# Patient Record
Sex: Male | Born: 1935 | Race: White | Hispanic: No | Marital: Married
Health system: Midwestern US, Community
[De-identification: ages and names within clinical notes are randomized; demographics above are authoritative.]

## PROBLEM LIST (undated history)

## (undated) DIAGNOSIS — Z483 Aftercare following surgery for neoplasm: Secondary | ICD-10-CM

## (undated) DIAGNOSIS — E859 Amyloidosis, unspecified: Secondary | ICD-10-CM

---

## 2015-08-04 LAB — PROSTATE SPECIFIC ANTIGEN, TOTAL (PSA): PROSTATIC ANTIGEN: 1.63 NG/ML (ref 0–4.0)

## 2015-11-14 ENCOUNTER — Ambulatory Visit

## 2016-08-04 LAB — PROSTATE SPECIFIC ANTIGEN, TOTAL (PSA): PROSTATIC ANTIGEN: 1.84 NG/ML (ref 0–4.0)

## 2018-03-15 ENCOUNTER — Encounter

## 2018-03-20 ENCOUNTER — Inpatient Hospital Stay: Admit: 2018-03-20 | Discharge: 2018-03-20 | Payer: MEDICARE

## 2018-03-20 DIAGNOSIS — E859 Amyloidosis, unspecified: Secondary | ICD-10-CM

## 2018-03-20 MED ORDER — ALBUTEROL SULFATE 0.083 % (0.83 MG/ML) SOLN FOR INHALATION
2.5 mg /3 mL (0.083 %) | RESPIRATORY_TRACT | Status: AC
Start: 2018-03-20 — End: 2018-03-20
  Administered 2018-03-20: 13:00:00 via RESPIRATORY_TRACT

## 2018-03-20 MED FILL — ALBUTEROL SULFATE 0.083 % (0.83 MG/ML) SOLN FOR INHALATION: 2.5 mg /3 mL (0.083 %) | RESPIRATORY_TRACT | Qty: 1

## 2018-03-20 NOTE — Procedures (Signed)
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  According to the pulmonary function lab questionnaire, the patient is a lifelong nonsmoker.  He denies cough and wheezing, but reports chest tightness and shortness of breath on exertion.    TEST COMMENTS:  Effort and cooperation are good.    TEST RESULTS:  The flow volume loop is normal.  Spirometry is acceptable and reproducible.  The ratio of FEV1/FVC is normal at 75.  Both forced vital capacity and FEV1 are 85% predicted, which is at the lower end of normal.  There was no significant bronchodilator response in FEV1.  Mid expiratory flow rate was normal.  Peak expiratory flow rate was also normal.     Lung volumes by plethysmography show a normal total lung capacity of 5.13 L, which is 81% predicted.  This is at the lower end of the normal range, which is 80% to 100% predicted.  There is no evidence for air trapping.     Diffusion capacity is normal at 98% predicted.     Comparison with prior examination is not available.    IMPRESSION:  Full, in-lab pulmonary function testing is normal.  This patient's lung volumes are at the lower end of the normal range.          ____________________________________  Noah Ceahristopher Childs Kenyatte Gruber, MD    CCD/MODL  D:  03/21/2018 13:15:35  T:  03/21/2018 13:26:08  MModal Job#: [id number]  Doc#:  [id number]    CC:??      Rosana HoesBoris Golosarsky, MD             [[id number]] [[id number]]                           [address], [address]                           [place] [id number]             Fax: [phone]

## 2018-03-21 NOTE — Procedures (Signed)
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  According to the pulmonary function lab questionnaire, the patient is a lifelong nonsmoker.  He denies cough and wheezing, but reports chest tightness and shortness of breath on exertion.    TEST COMMENTS:  Effort and cooperation are good.    TEST RESULTS:  The flow volume loop is normal.  Spirometry is acceptable and reproducible.  The ratio of FEV1/FVC is normal at 75.  Both forced vital capacity and FEV1 are 85% predicted, which is at the lower end of normal.  There was no significant bronchodilator response in FEV1.  Mid expiratory flow rate was normal.  Peak expiratory flow rate was also normal.     Lung volumes by plethysmography show a normal total lung capacity of 5.13 L, which is 81% predicted.  This is at the lower end of the normal range, which is 80% to 100% predicted.  There is no evidence for air trapping.     Diffusion capacity is normal at 98% predicted.     Comparison with prior examination is not available.    IMPRESSION:  Full, in-lab pulmonary function testing is normal.  This patient's lung volumes are at the lower end of the normal range.          ____________________________________  Leliana Kontz Childs Analiyah Lechuga, MD    CCD/MODL  D:  03/21/2018 13:15:35  T:  03/21/2018 13:26:08  MModal Job#: [id number]  Doc#:  [id number]    CC:??      Boris Golosarsky, MD             [[id number]] [[id number]]                           [address], [address]                           [place] [address]             Fax: [phone]

## 2020-02-18 LAB — LIPID PROFILE (EXT)
Chol/HDL Ratio (EXT): 3.2
Cholesterol (EXT): 175 mg/dL (ref <=200)
HDL Cholesterol (EXT): 55 mg/dL (ref 41–60)
LDL Cholesterol (EXT): 118 mg/dL (ref <=130)
Triglycerides (EXT): 60 mg/dL (ref <=150)

## 2020-03-24 LAB — LIPID PROFILE (EXT)
Chol/HDL Ratio (EXT): 3.1
Cholesterol (EXT): 116 mg/dL (ref <=200)
HDL Cholesterol (EXT): 38 mg/dL — ABNORMAL LOW (ref 41–60)
LDL Cholesterol (EXT): 74 mg/dL (ref <=130)
Triglycerides (EXT): 48 mg/dL (ref <=150)

## 2020-03-25 LAB — UNMAPPED LAB RESULTS: Creatinine, urine, random (INT/EXT): 32 mg/dL

## 2020-03-25 LAB — OCCULT BLOOD, FECAL (EXT): Occult Blood, Fecal (EXT): NEGATIVE

## 2020-05-01 LAB — UNMAPPED LAB RESULTS: Phosphorous (EXT): 3.6 mg/dL (ref 2.4–5.1)

## 2020-05-20 LAB — UNMAPPED LAB RESULTS: Phosphorous (EXT): 4.8 mg/dL (ref 2.4–5.1)

## 2020-06-03 LAB — UNMAPPED LAB RESULTS: Phosphorous (EXT): 4 mg/dL (ref 2.4–5.1)

## 2020-06-17 LAB — UNMAPPED LAB RESULTS: Phosphorous (EXT): 4 mg/dL (ref 2.4–5.1)

## 2020-07-16 LAB — BMP (EXT)
Anion Gap (EXT): 16 mmol/L (ref 3–17)
BUN (EXT): 29 mg/dL — ABNORMAL HIGH (ref 8–25)
CO2 (EXT): 20 mmol/L — ABNORMAL LOW (ref 23–32)
CalciumCalcium (EXT): 8.7 mg/dL (ref 8.5–10.5)
Chloride (EXT): 103 mmol/L (ref 98–108)
Creatinine (EXT): 1.37 mg/dL (ref 0.60–1.50)
GFR Estimated (Calc) (EXT): 51 mL/min/{1.73_m2} — ABNORMAL LOW (ref >59)
Glucose (EXT): 145 mg/dL — ABNORMAL HIGH (ref 70–110)
Potassium (EXT): 4.7 mmol/L (ref 3.4–5.0)
Sodium (EXT): 139 mmol/L (ref 135–145)

## 2020-07-17 LAB — BMP (EXT)
Anion Gap (EXT): 10 mmol/L (ref 3–17)
BUN (EXT): 28 mg/dL — ABNORMAL HIGH (ref 8–25)
CO2 (EXT): 24 mmol/L (ref 23–32)
CalciumCalcium (EXT): 8.6 mg/dL (ref 8.5–10.5)
Chloride (EXT): 106 mmol/L (ref 98–108)
Creatinine (EXT): 1.06 mg/dL (ref 0.60–1.50)
GFR Estimated (Calc) (EXT): 69 mL/min/{1.73_m2} (ref >59)
Glucose (EXT): 80 mg/dL (ref 70–110)
Potassium (EXT): 4.3 mmol/L (ref 3.4–5.0)
Sodium (EXT): 140 mmol/L (ref 135–145)

## 2020-07-18 LAB — BMP (EXT)
Anion Gap (EXT): 14 mmol/L (ref 3–17)
BUN (EXT): 23 mg/dL (ref 8–25)
CO2 (EXT): 22 mmol/L — ABNORMAL LOW (ref 23–32)
CalciumCalcium (EXT): 8.5 mg/dL (ref 8.5–10.5)
Chloride (EXT): 103 mmol/L (ref 98–108)
Creatinine (EXT): 0.95 mg/dL (ref 0.60–1.50)
GFR Estimated (Calc) (EXT): 78 mL/min/{1.73_m2} (ref >59)
Glucose (EXT): 60 mg/dL — ABNORMAL LOW (ref 70–110)
Potassium (EXT): 4 mmol/L (ref 3.4–5.0)
Sodium (EXT): 139 mmol/L (ref 135–145)

## 2020-07-19 LAB — BMP (EXT)
Anion Gap (EXT): 13 mmol/L (ref 3–17)
BUN (EXT): 20 mg/dL (ref 8–25)
CO2 (EXT): 24 mmol/L (ref 23–32)
CalciumCalcium (EXT): 8.7 mg/dL (ref 8.5–10.5)
Chloride (EXT): 102 mmol/L (ref 98–108)
Creatinine (EXT): 0.94 mg/dL (ref 0.60–1.50)
GFR Estimated (Calc) (EXT): 79 mL/min/{1.73_m2} (ref >59)
Glucose (EXT): 65 mg/dL — ABNORMAL LOW (ref 70–110)
Potassium (EXT): 4 mmol/L (ref 3.4–5.0)
Sodium (EXT): 139 mmol/L (ref 135–145)

## 2020-07-20 LAB — BMP (EXT)
Anion Gap (EXT): 12 mmol/L (ref 3–17)
BUN (EXT): 17 mg/dL (ref 8–25)
CO2 (EXT): 23 mmol/L (ref 23–32)
CalciumCalcium (EXT): 8.6 mg/dL (ref 8.5–10.5)
Chloride (EXT): 101 mmol/L (ref 98–108)
Creatinine (EXT): 0.96 mg/dL (ref 0.60–1.50)
GFR Estimated (Calc) (EXT): 77 mL/min/{1.73_m2} (ref >59)
Glucose (EXT): 100 mg/dL (ref 70–110)
Potassium (EXT): 4.1 mmol/L (ref 3.4–5.0)
Sodium (EXT): 136 mmol/L (ref 135–145)

## 2020-07-21 LAB — BMP (EXT)
Anion Gap (EXT): 17 mmol/L (ref 3–17)
BUN (EXT): 14 mg/dL (ref 8–25)
CO2 (EXT): 21 mmol/L — ABNORMAL LOW (ref 23–32)
CalciumCalcium (EXT): 8.7 mg/dL (ref 8.5–10.5)
Chloride (EXT): 99 mmol/L (ref 98–108)
Creatinine (EXT): 0.95 mg/dL (ref 0.60–1.50)
GFR Estimated (Calc) (EXT): 78 mL/min/{1.73_m2} (ref >59)
Glucose (EXT): 76 mg/dL (ref 70–110)
Potassium (EXT): 3.8 mmol/L (ref 3.4–5.0)
Sodium (EXT): 137 mmol/L (ref 135–145)

## 2020-07-22 LAB — BMP (EXT)
Anion Gap (EXT): 13 mmol/L (ref 3–17)
BUN (EXT): 14 mg/dL (ref 8–25)
CO2 (EXT): 23 mmol/L (ref 23–32)
CalciumCalcium (EXT): 8.7 mg/dL (ref 8.5–10.5)
Chloride (EXT): 103 mmol/L (ref 98–108)
Creatinine (EXT): 0.86 mg/dL (ref 0.60–1.50)
GFR Estimated (Calc) (EXT): 85 mL/min/{1.73_m2} (ref >59)
Glucose (EXT): 83 mg/dL (ref 70–110)
Potassium (EXT): 4.2 mmol/L (ref 3.4–5.0)
Sodium (EXT): 139 mmol/L (ref 135–145)

## 2020-07-22 LAB — TRIGLYCERIDES (EXT): Triglycerides (EXT): 95 mg/dL (ref 40–150)

## 2020-07-23 LAB — BMP (EXT)
Anion Gap (EXT): 15 mmol/L (ref 3–17)
BUN (EXT): 17 mg/dL (ref 8–25)
CO2 (EXT): 25 mmol/L (ref 23–32)
CalciumCalcium (EXT): 8.8 mg/dL (ref 8.5–10.5)
Chloride (EXT): 97 mmol/L — ABNORMAL LOW (ref 98–108)
Creatinine (EXT): 0.92 mg/dL (ref 0.60–1.50)
GFR Estimated (Calc) (EXT): 82 mL/min/{1.73_m2} (ref >59)
Glucose (EXT): 141 mg/dL — ABNORMAL HIGH (ref 70–110)
Potassium (EXT): 3.9 mmol/L (ref 3.4–5.0)
Sodium (EXT): 137 mmol/L (ref 135–145)

## 2020-07-24 ENCOUNTER — Encounter

## 2020-07-24 LAB — BMP (EXT)
Anion Gap (EXT): 12 mmol/L (ref 3–17)
BUN (EXT): 20 mg/dL (ref 8–25)
CO2 (EXT): 28 mmol/L (ref 23–32)
CalciumCalcium (EXT): 8.8 mg/dL (ref 8.5–10.5)
Chloride (EXT): 99 mmol/L (ref 98–108)
Creatinine (EXT): 0.93 mg/dL (ref 0.60–1.50)
GFR Estimated (Calc) (EXT): 80 mL/min/{1.73_m2} (ref >59)
Glucose (EXT): 113 mg/dL — ABNORMAL HIGH (ref 70–110)
Potassium (EXT): 4 mmol/L (ref 3.4–5.0)
Sodium (EXT): 139 mmol/L (ref 135–145)

## 2020-07-24 NOTE — Progress Notes (Signed)
Is the patient appropriate for Home Care?     Homebound Status: ***  Covid status: ***  Insurance carrier: No coverage found.     Date of potential discharge: ***    Is there a next day need?     Does the patient have a PCP?     Is the patient a [facility] patient?    Does patient use Home O2    Have your cordinated with the IP care team on the patients IV?   Have your coordinated with the IP care team on the patients Drains?   Have your coordinated with the IP care team on the patients Wounds?

## 2020-07-25 LAB — BMP (EXT)
Anion Gap (EXT): 12 mmol/L (ref 3–17)
BUN (EXT): 23 mg/dL (ref 8–25)
CO2 (EXT): 26 mmol/L (ref 23–32)
CalciumCalcium (EXT): 8.7 mg/dL (ref 8.5–10.5)
Chloride (EXT): 98 mmol/L (ref 98–108)
Creatinine (EXT): 0.94 mg/dL (ref 0.60–1.50)
GFR Estimated (Calc) (EXT): 79 mL/min/{1.73_m2} (ref >59)
Glucose (EXT): 125 mg/dL — ABNORMAL HIGH (ref 70–110)
Potassium (EXT): 4.8 mmol/L (ref 3.4–5.0)
Sodium (EXT): 136 mmol/L (ref 135–145)

## 2020-07-26 LAB — BMP (EXT)
Anion Gap (EXT): 12 mmol/L (ref 3–17)
Anion Gap (EXT): 11 mmol/L (ref 3–17)
BUN (EXT): 29 mg/dL — ABNORMAL HIGH (ref 8–25)
BUN (EXT): 12 mg/dL (ref 8–25)
CO2 (EXT): 29 mmol/L (ref 23–32)
CO2 (EXT): 26 mmol/L (ref 23–32)
CalciumCalcium (EXT): 8.5 mg/dL (ref 8.5–10.5)
CalciumCalcium (EXT): 8.7 mg/dL (ref 8.5–10.5)
Chloride (EXT): 99 mmol/L (ref 98–108)
Chloride (EXT): 105 mmol/L (ref 98–108)
Creatinine (EXT): 1.05 mg/dL (ref 0.60–1.50)
Creatinine (EXT): 1 mg/dL (ref 0.60–1.50)
GFR Estimated (Calc) (EXT): 70 mL/min/{1.73_m2} (ref >59)
GFR Estimated (Calc) (EXT): 74 mL/min/{1.73_m2} (ref >59)
Glucose (EXT): 126 mg/dL — ABNORMAL HIGH (ref 70–110)
Glucose (EXT): 95 mg/dL (ref 70–110)
Potassium (EXT): 4.8 mmol/L (ref 3.4–5.0)
Potassium (EXT): 4.2 mmol/L (ref 3.4–5.0)
Sodium (EXT): 140 mmol/L (ref 135–145)
Sodium (EXT): 142 mmol/L (ref 135–145)

## 2020-07-27 LAB — BMP (EXT)
Anion Gap (EXT): 8 mmol/L (ref 3–17)
BUN (EXT): 30 mg/dL — ABNORMAL HIGH (ref 8–25)
CO2 (EXT): 30 mmol/L (ref 23–32)
CalciumCalcium (EXT): 8.5 mg/dL (ref 8.5–10.5)
Chloride (EXT): 101 mmol/L (ref 98–108)
Creatinine (EXT): 0.96 mg/dL (ref 0.60–1.50)
GFR Estimated (Calc) (EXT): 77 mL/min/{1.73_m2} (ref >59)
Glucose (EXT): 134 mg/dL — ABNORMAL HIGH (ref 70–110)
Potassium (EXT): 4.7 mmol/L (ref 3.4–5.0)
Sodium (EXT): 139 mmol/L (ref 135–145)

## 2020-07-28 LAB — BMP (EXT)
Anion Gap (EXT): 9 mmol/L (ref 3–17)
BUN (EXT): 29 mg/dL — ABNORMAL HIGH (ref 8–25)
CO2 (EXT): 28 mmol/L (ref 23–32)
CalciumCalcium (EXT): 8.3 mg/dL — ABNORMAL LOW (ref 8.5–10.5)
Chloride (EXT): 98 mmol/L (ref 98–108)
Creatinine (EXT): 0.9 mg/dL (ref 0.60–1.50)
GFR Estimated (Calc) (EXT): 84 mL/min/{1.73_m2} (ref >59)
Glucose (EXT): 139 mg/dL — ABNORMAL HIGH (ref 70–110)
Potassium (EXT): 4.4 mmol/L (ref 3.4–5.0)
Sodium (EXT): 135 mmol/L (ref 135–145)

## 2020-07-29 LAB — BMP (EXT)
Anion Gap (EXT): 10 mmol/L (ref 3–17)
BUN (EXT): 25 mg/dL (ref 8–25)
CO2 (EXT): 28 mmol/L (ref 23–32)
CalciumCalcium (EXT): 8.8 mg/dL (ref 8.5–10.5)
Chloride (EXT): 100 mmol/L (ref 98–108)
Creatinine (EXT): 0.97 mg/dL (ref 0.60–1.50)
GFR Estimated (Calc) (EXT): 77 mL/min/{1.73_m2} (ref >59)
Glucose (EXT): 132 mg/dL — ABNORMAL HIGH (ref 70–110)
Potassium (EXT): 4.6 mmol/L (ref 3.4–5.0)
Sodium (EXT): 138 mmol/L (ref 135–145)

## 2020-08-04 LAB — UNMAPPED LAB RESULTS: Phosphorous (EXT): 3.9 mg/dL (ref 2.4–5.1)

## 2020-08-04 LAB — TRIGLYCERIDES (EXT): Triglycerides (EXT): 76 mg/dL (ref <=150)

## 2020-08-11 LAB — TRIGLYCERIDES (EXT): Triglycerides (EXT): 75 mg/dL (ref <=150)

## 2020-08-11 LAB — UNMAPPED LAB RESULTS: Phosphorous (EXT): 4.2 mg/dL (ref 2.4–5.1)

## 2020-08-19 LAB — TRIGLYCERIDES (EXT): Triglycerides (EXT): 71 mg/dL (ref <=150)

## 2020-08-20 LAB — UNMAPPED LAB RESULTS: Phosphorous (EXT): 4.8 mg/dL (ref 2.4–5.1)

## 2020-08-26 ENCOUNTER — Encounter

## 2020-08-26 ENCOUNTER — Inpatient Hospital Stay: Admit: 2020-08-26 | Payer: MEDICARE

## 2020-08-26 DIAGNOSIS — Z483 Aftercare following surgery for neoplasm: Secondary | ICD-10-CM

## 2020-08-26 LAB — CBC
Hematocrit: 35.5 % — ABNORMAL LOW (ref 40.1–51.0)
Hemoglobin: 11.1 g/dL — ABNORMAL LOW (ref 13.7–17.5)
MCH: 27.4 PG (ref 25.6–32.2)
MCHC: 31.3 g/dL — ABNORMAL LOW (ref 32.2–35.5)
MCV: 87.7 FL (ref 80.0–95.0)
MPV: 11.2 FL (ref 9.4–12.4)
Platelets: 257 10*3/uL (ref 150–400)
RBC: 4.05 M/uL — ABNORMAL LOW (ref 4.51–5.93)
RDW: 15.4 % — ABNORMAL HIGH (ref 11.6–14.4)
WBC: 6.9 10*3/uL (ref 4.0–10.0)

## 2020-08-26 LAB — COMPREHENSIVE METABOLIC PANEL
ALT: 52 U/L — ABNORMAL HIGH (ref 0–50)
AST: 39 U/L (ref 0–50)
Albumin/Globulin Ratio: 1.6 (ref 1.0–3.0)
Albumin: 3.9 g/dL (ref 3.5–5.2)
Alkaline Phosphatase: 125 U/L (ref 40–129)
Anion Gap: 13 mmol/L (ref 5–15)
BUN: 31 MG/DL — ABNORMAL HIGH (ref 8–23)
Bun/Cre Ratio: 29 NA — ABNORMAL HIGH (ref 12–20)
CO2: 26 mmol/L (ref 22–29)
Calcium: 9.1 MG/DL (ref 8.8–10.2)
Chloride: 101 mmol/L (ref 98–107)
Creatinine: 1.08 MG/DL (ref 0.67–1.17)
EGFR IF NonAfrican American: >60 mL/min/{1.73_m2} (ref >60)
GFR African American: >60 mL/min/{1.73_m2} (ref >60)
Glucose: 116 mg/dL — ABNORMAL HIGH (ref 74–109)
Potassium: 4 mmol/L (ref 3.5–5.1)
Sodium: 140 mmol/L (ref 136–145)
Total Bilirubin: 0.41 mg/dL (ref 0–1.20)
Total Protein: 6.3 g/dL — ABNORMAL LOW (ref 6.4–8.3)

## 2020-08-26 LAB — TRIGLYCERIDES: Triglycerides: 61 MG/DL (ref 0–150)

## 2020-08-26 LAB — PHOSPHORUS
Phosphorus: 3.9 mg/dL (ref 2.5–4.5)
Phosphorus: 3.9 MG/DL (ref 2.5–4.5)

## 2020-08-26 LAB — MAGNESIUM
Magnesium: 2.1 mg/dL (ref 1.6–2.4)
Magnesium: 2.1 mg/dL (ref 1.6–2.4)

## 2020-08-26 LAB — TRIGLYCERIDES (EXT): Triglycerides (EXT): 61 mg/dL (ref 0–150)

## 2020-08-26 LAB — CBC W/O DIFF
HCT: 35.5 % — ABNORMAL LOW (ref 40.1–51.0)
HGB: 11.1 g/dL — ABNORMAL LOW (ref 13.7–17.5)
MCH: 27.4 pg (ref 25.6–32.2)
MCHC: 31.3 g/dL — ABNORMAL LOW (ref 32.2–35.5)
MCV: 87.7 fL (ref 80.0–95.0)
MPV: 11.2 fL (ref 9.4–12.4)
PLATELET: 257 10*3/uL (ref 150–400)
RBC: 4.05 M/uL — ABNORMAL LOW (ref 4.51–5.93)
RDW: 15.4 % — ABNORMAL HIGH (ref 11.6–14.4)
WBC: 6.9 10*3/uL (ref 4.0–10.0)

## 2020-08-26 LAB — METABOLIC PANEL, COMPREHENSIVE
A-G Ratio: 1.6 (ref 1.0–3.0)
ALT (SGPT): 52 U/L — ABNORMAL HIGH (ref 0–50)
AST (SGOT): 39 U/L (ref 0–50)
Albumin: 3.9 g/dL (ref 3.5–5.2)
Alk. phosphatase: 125 U/L (ref 40–129)
Anion gap: 13 mmol/L (ref 5–15)
BUN/Creatinine ratio: 29 — ABNORMAL HIGH (ref 12–20)
BUN: 31 MG/DL — ABNORMAL HIGH (ref 8–23)
Bilirubin, total: 0.41 mg/dL (ref 0–1.20)
CO2: 26 mmol/L (ref 22–29)
Calcium: 9.1 mg/dL (ref 8.8–10.2)
Chloride: 101 mmol/L (ref 98–107)
Creatinine: 1.08 mg/dL (ref 0.67–1.17)
GFR est AA: >60 mL/min/{1.73_m2} (ref >60)
GFR est non-AA: >60 mL/min/{1.73_m2} (ref >60)
Glucose: 116 mg/dL — ABNORMAL HIGH (ref 74–109)
Potassium: 4 mmol/L (ref 3.5–5.1)
Protein, total: 6.3 g/dL — ABNORMAL LOW (ref 6.4–8.3)
Sodium: 140 mmol/L (ref 136–145)

## 2020-08-26 LAB — FAX TO
FAX TO INFO: 18558107139
FAX TO NUMBER: 8558107139

## 2020-08-26 LAB — TRIGLYCERIDE: Triglyceride: 61 mg/dL (ref 0–150)

## 2020-08-27 LAB — LIPID PROFILE (EXT)
Chol/HDL Ratio (EXT): 4.2
Cholesterol (EXT): 143 mg/dL (ref <=200)
HDL Cholesterol (EXT): 34 mg/dL — ABNORMAL LOW (ref 41–60)
LDL Cholesterol (EXT): 98 mg/dL (ref <=130)
Triglycerides (EXT): 86 mg/dL (ref <=150)

## 2020-09-15 LAB — UNMAPPED LAB RESULTS: Phosphorous (EXT): 3.6 mg/dL (ref 2.4–5.1)

## 2020-09-30 LAB — UNMAPPED LAB RESULTS: Phosphorous (EXT): 3.8 mg/dL (ref 2.4–5.1)

## 2020-10-13 LAB — UNMAPPED LAB RESULTS: Phosphorous (EXT): 4.4 mg/dL (ref 2.4–5.1)

## 2020-10-28 LAB — UNMAPPED LAB RESULTS: Phosphorous (EXT): 4 mg/dL (ref 2.4–5.1)

## 2020-11-04 LAB — UNMAPPED LAB RESULTS: Phosphorous (EXT): 3.3 mg/dL (ref 2.4–5.1)

## 2020-11-11 LAB — UNMAPPED LAB RESULTS: Phosphorous (EXT): 3.7 mg/dL (ref 2.4–5.1)

## 2020-11-25 LAB — UNMAPPED LAB RESULTS: Phosphorous (EXT): 4.3 mg/dL (ref 2.4–5.1)

## 2021-01-14 LAB — UNMAPPED LAB RESULTS: Phosphorous (EXT): 3.4 mg/dL (ref 2.4–5.1)

## 2021-02-03 LAB — UNMAPPED LAB RESULTS: Phosphorous (EXT): 3.8 mg/dL (ref 2.4–5.1)

## 2021-03-09 LAB — UNMAPPED LAB RESULTS: Phosphorous (EXT): 4.3 mg/dL (ref 2.4–5.1)

## 2021-05-25 IMAGING — CT CT CHEST/ABDOMEN AND PELVIS WITH CONTRAST
1 of 3 series · 13 of 32 positions shown, 18 images · IV contrast (agent unspecified)
Comparison: CT from March 08, 2021.

________________________________________________________________________________________________ 
CT CHEST/ABDOMEN AND PELVIS WITH CONTRAST, 05/25/2021 [DATE]: 
(Films were taken at [facility].)  
CLINICAL INDICATION:  Metastatic gastric cancer. 
A search for DICOM formatted images was conducted for prior CT imaging studies 
completed at a non-affiliated media free facility.
TECHNIQUE: The region of interest was scanned with 100 mL IV contrast on a high 
resolution CT scanner.  Routine MPR reconstructions were performed.

[Series 2: cap w · axial · 0.77mm/px · z∈[+599,+1190]mm · 13 of 223 slices shown, 18 images]
[im 13/223  soft-tissue]
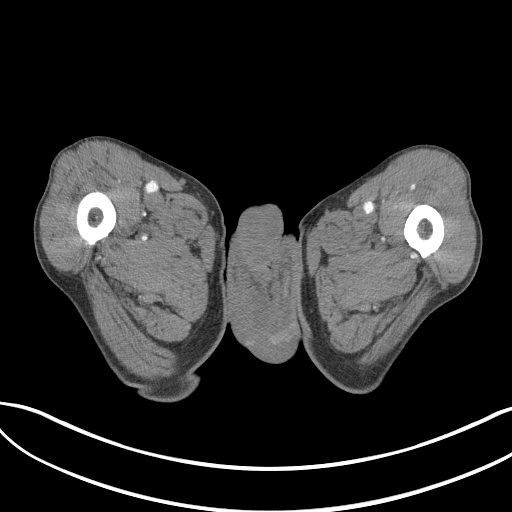
[im 13/223  bone]
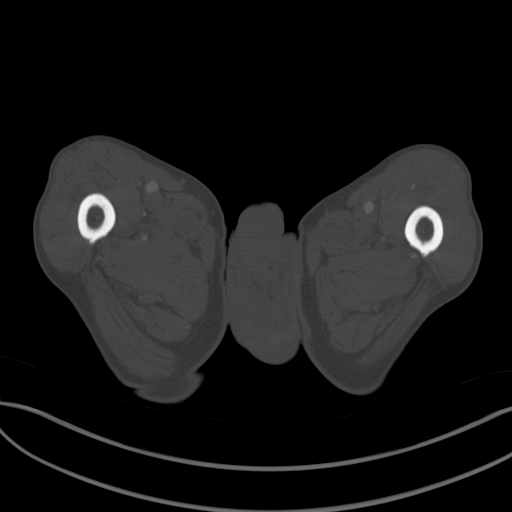
[im 38/223  soft-tissue]
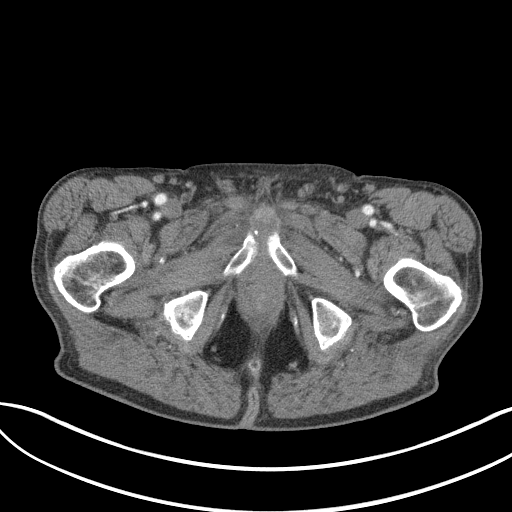
[im 50/223  soft-tissue]
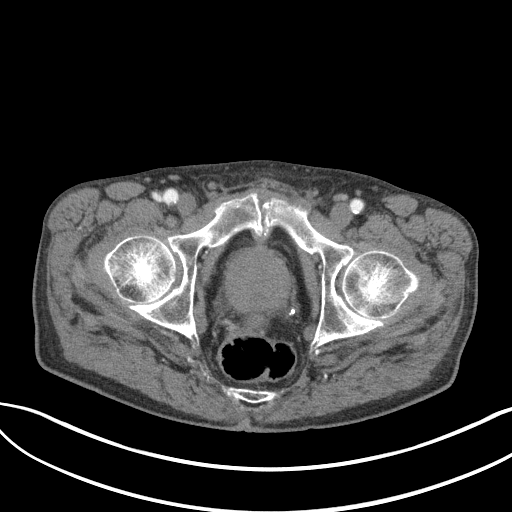
[im 62/223  soft-tissue]
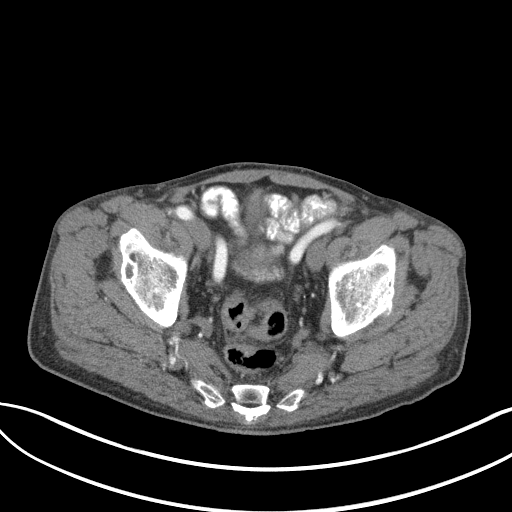
[im 87/223  soft-tissue]
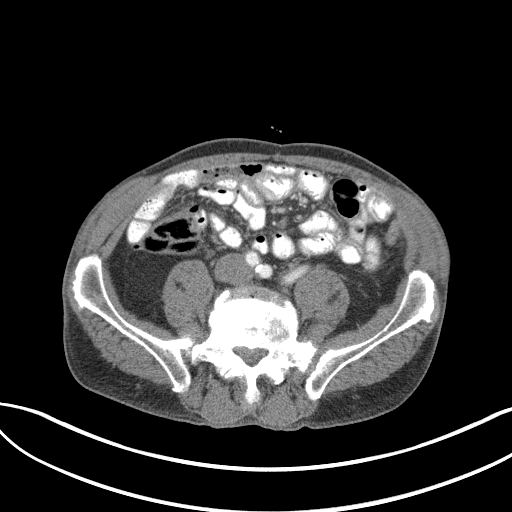
[im 99/223  soft-tissue]
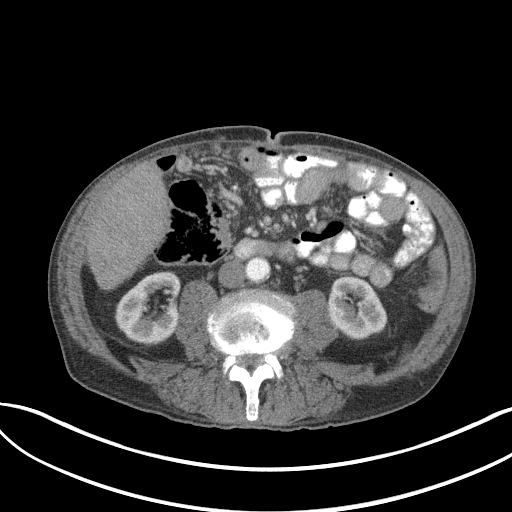
[im 124/223  soft-tissue]
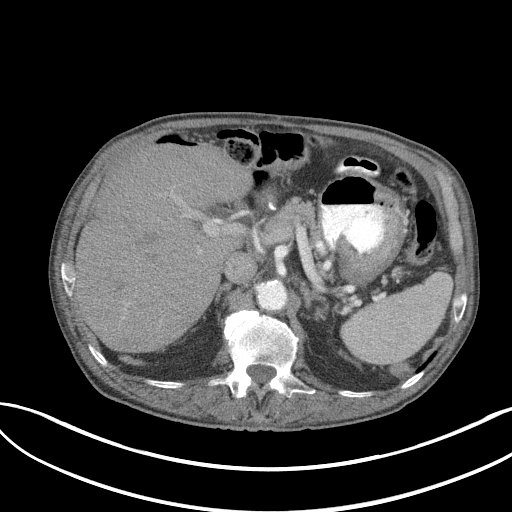
[im 136/223  soft-tissue]
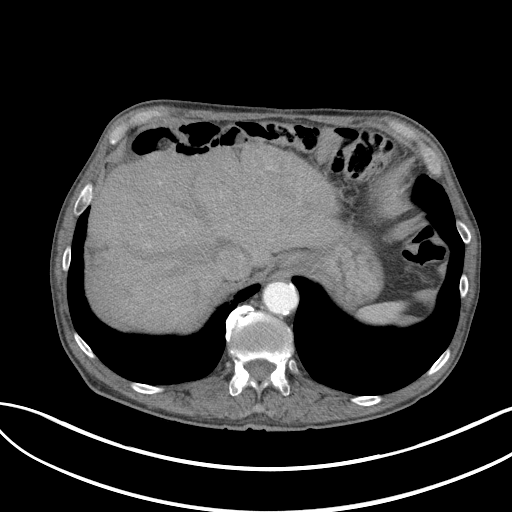
[im 161/223  soft-tissue]
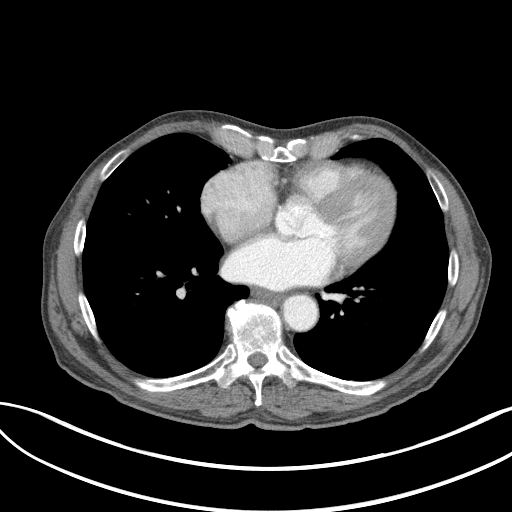
[im 161/223  bone]
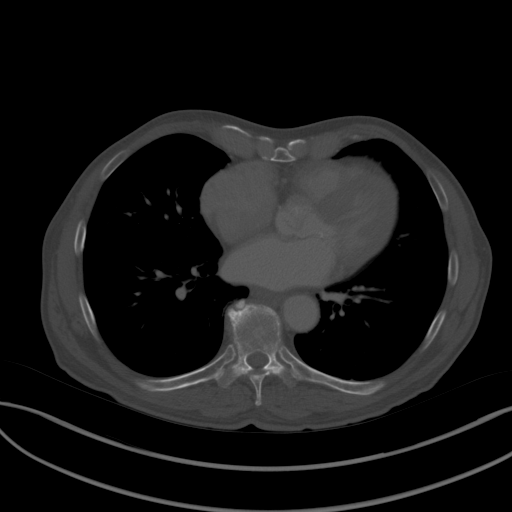
[im 173/223  soft-tissue]
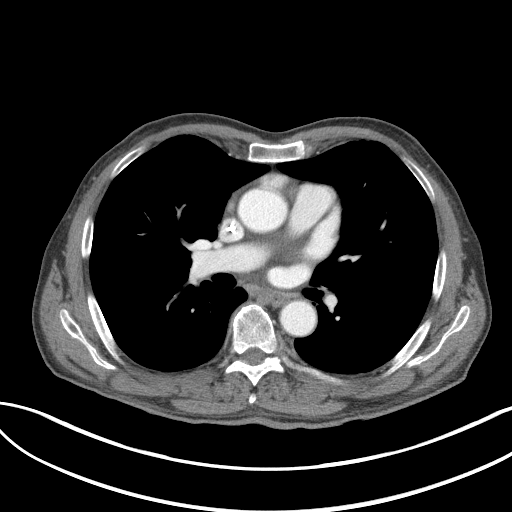
[im 173/223  lung]
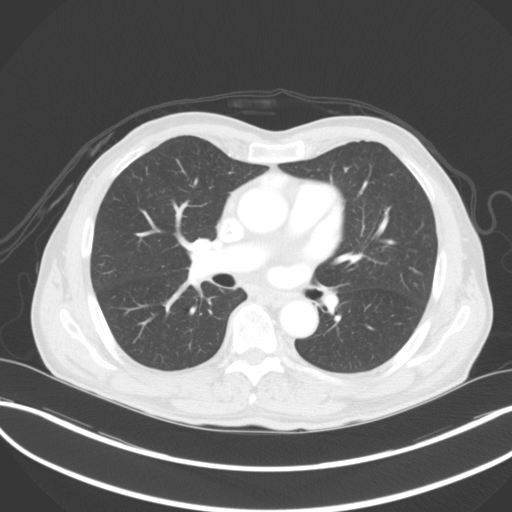
[im 186/223  soft-tissue]
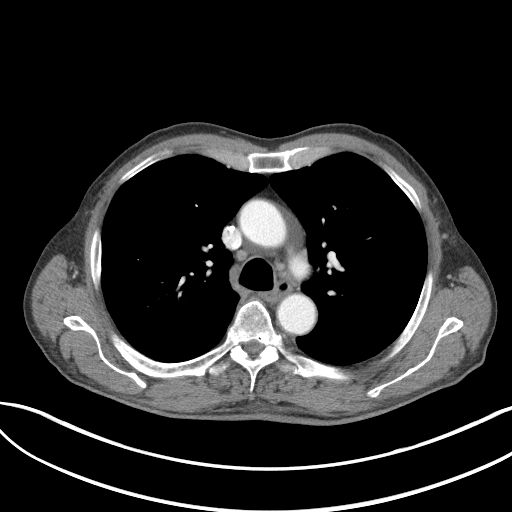
[im 186/223  lung]
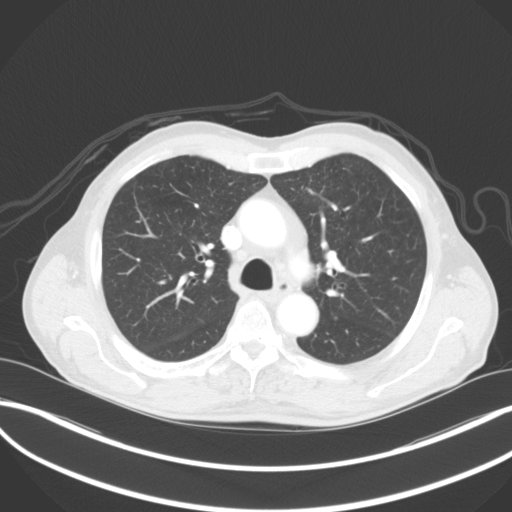
[im 198/223  lung]
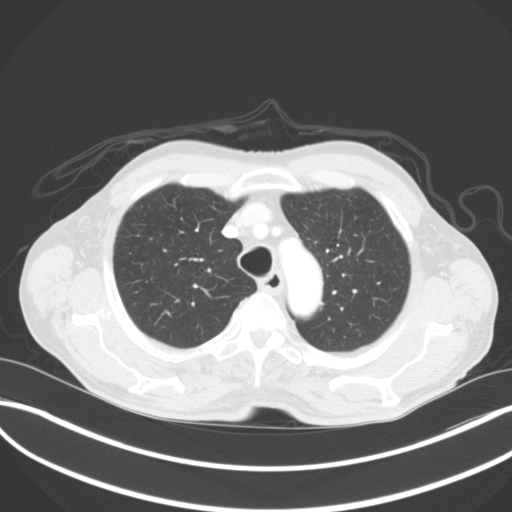
[im 210/223  soft-tissue]
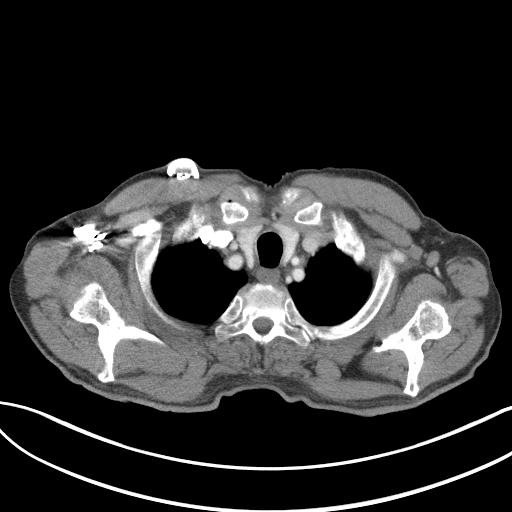
[im 210/223  lung]
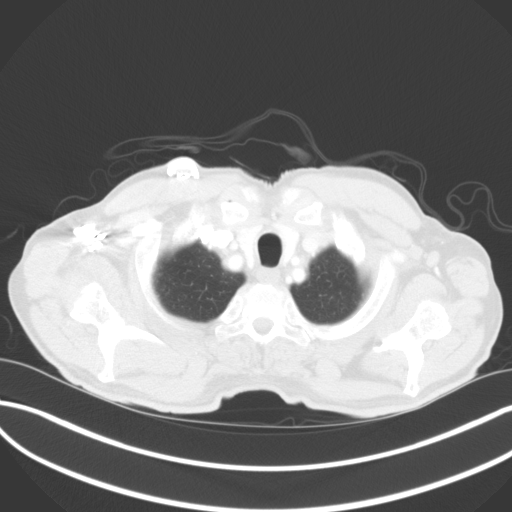

[13 of 32 positions shown; findings below may reference images not displayed]

FINDINGS: --------------------------------------------------------------------------- 
LUNGS AND PLEURA:  The lungs are clear. No pleural effusion.  
MEDIASTINUM:  No pathologic lymphadenopathy.   
CARDIOVASCULAR: Heart size is normal.  Coronary artery calcifications. No 
aneurysm.  Vascular calcifications. 
LOWER NECK: No focal mass. 
CHEST WALL/AXILLA: No mass or adenopathy.  
OSSEOUS STRUCTURES: No acute osseous abnormality. Scattered degenerative 
changes.  
--------------------------------------------------------------------------- 
HEPATOBILIARY: Metastatic lesion in the right lobe the liver measuring up to
cm has decreased in size from prior CT were measured up to 3.1 cm. Lesion in the 
right lobe of liver more inferiorly measuring 1.6 cm previously measured 1.9 cm. 
No gallstones. 
PANCREAS: No ductal dilatation or mass.   
SPLEEN: Normal in size. 
ADRENALS: No mass. 
GENITOURINARY: No evidence for enhancing mass, stones or hydronephrosis.  Too 
small to characterize low-attenuation lesion (likely cyst) in the left kidney 
upper pole, stable. Large stone in the bladder measuring 2.2 cm, stable. 
STOMACH, SMALL BOWEL AND COLON:  Status post partial gastrectomy. Diverticulum 
arising from the duodenum extending into the pancreatic head. No bowel wall 
thickening or obstruction. 
ABDOMINAL/PELVIC LYMPH NODES: No adenopathy. 
VASCULAR STRUCTURES: No aneurysm. Vascular calcifications. 
PERITONEUM: Very mild fluid adjacent to the liver, overall slightly 
improved.Trace free fluid in the pelvis which is new. 
ABDOMINAL/PELVIC WALL: No mass or adenopathy.  
OSSEOUS STRUCTURES: No acute osseous abnormality. Scattered degenerative 
changes.  
ADDITIONAL FINDINGS: None. 
---------------------------------------------------------------------------
IMPRESSION: 1.  Improvement in hepatic metastatic burden. 
2.  No metastatic disease in the chest. 
RADIATION DOSE REDUCTION: All CT scans are performed using radiation dose 
reduction techniques, when applicable.  Technical factors are evaluated and 
adjusted to ensure appropriate moderation of exposure.  Automated dose 
management technology is applied to adjust the radiation doses to minimize 
exposure while achieving diagnostic quality images.

## 2022-02-28 LAB — UNMAPPED LAB RESULTS: PSA (EXT): 0.78 ng/mL (ref <=4.00)

## 2022-03-15 ENCOUNTER — Ambulatory Visit: Admit: 2022-03-15 | Discharge: 2022-03-15 | Payer: MEDICARE

## 2022-03-15 DIAGNOSIS — H903 Sensorineural hearing loss, bilateral: Secondary | ICD-10-CM

## 2022-03-15 NOTE — Progress Notes (Signed)
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

## 2022-04-15 IMAGING — CT CT CHEST/ABDOMEN AND PELVIS WITH CONTRAST
1 of 3 series · 13 of 32 positions shown, 18 images · IV contrast (agent unspecified)
Comparison: Comparison was made to the prior exam(s) within the last 12 months 
02/12/2022. Close 12/14/2021 and 05/25/2021

________________________________________________________________________________________________ 
CT CHEST/ABDOMEN AND PELVIS WITH CONTRAST, 04/15/2022 [DATE]: 
CLINICAL INDICATION: Malignant neoplasm of the pyloric antrum. 
A search for DICOM formatted images was conducted for prior CT imaging studies 
completed at a non-affiliated media free facility.
TECHNIQUE: The chest, abdomen and pelvis were scanned from base of neck through 
the pubic rami with 80 ml of Osovue-0BV injected intravenously on a high 
resolution CT scanner. Routine MPR reconstructions were performed. Count of 
known CT and Cardiac Nuclear Medicine studies performed in the previous 12 
months = 3

[Series 2: cap w · axial · 0.76mm/px · z∈[-916,-319]mm · 13 of 227 slices shown, 18 images]
[im 14/227  soft-tissue]
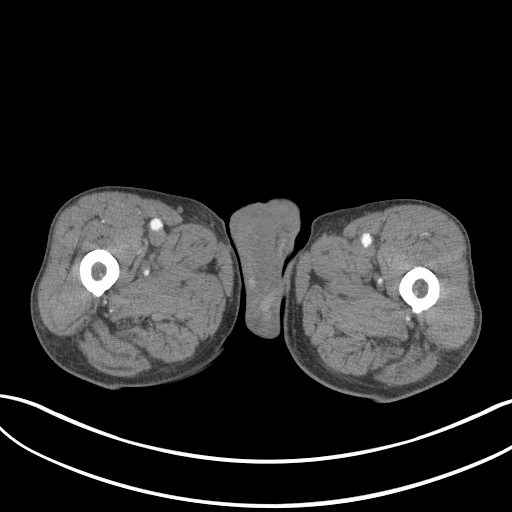
[im 14/227  bone]
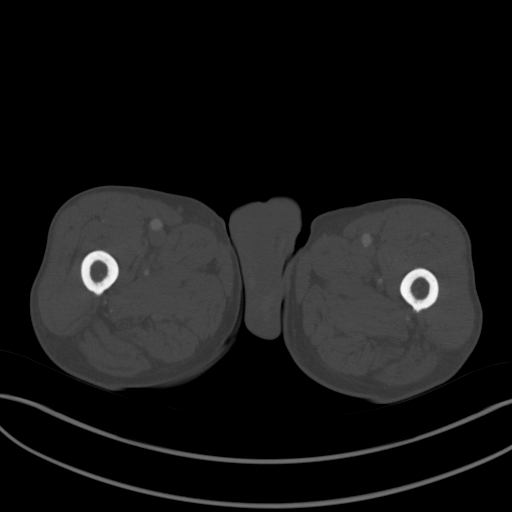
[im 40/227  soft-tissue]
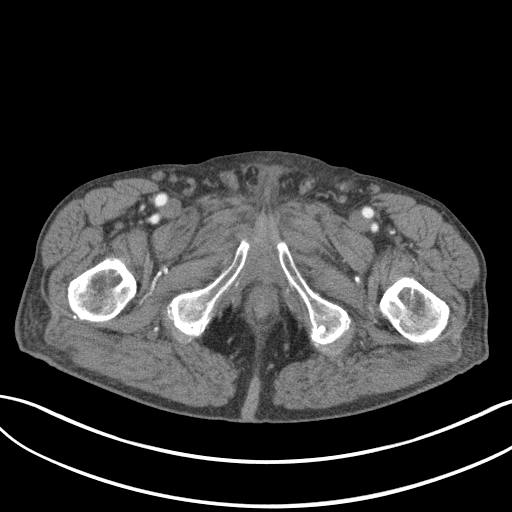
[im 54/227  soft-tissue]
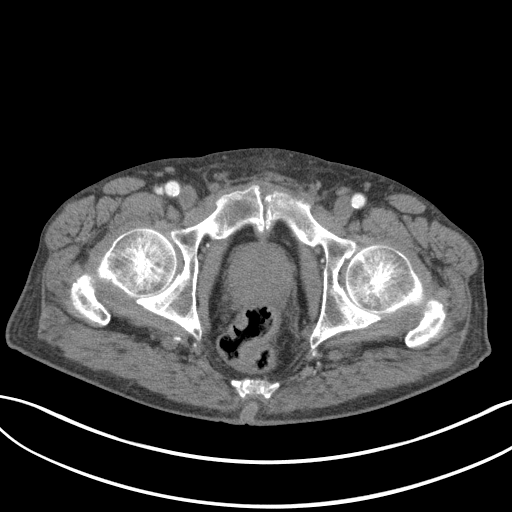
[im 67/227  soft-tissue]
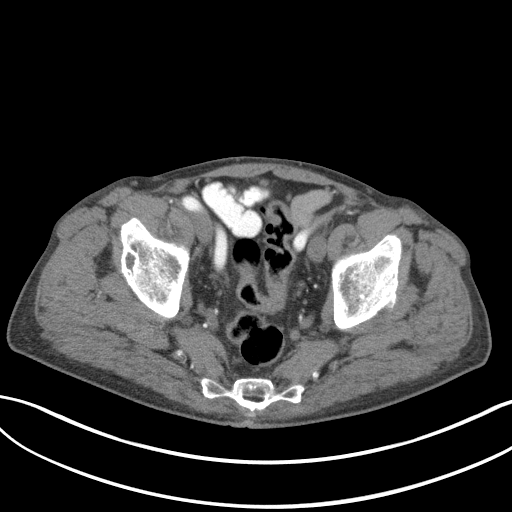
[im 94/227  soft-tissue]
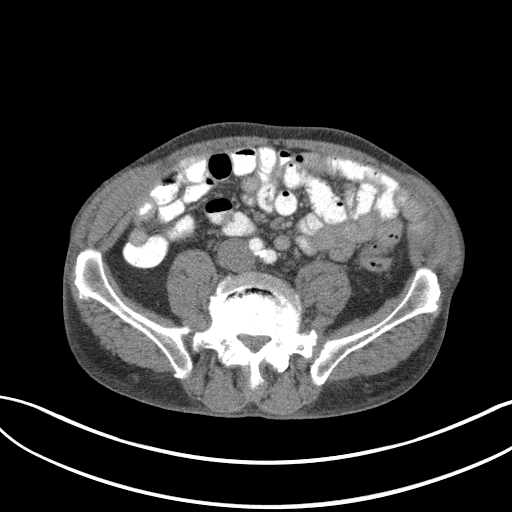
[im 107/227  soft-tissue]
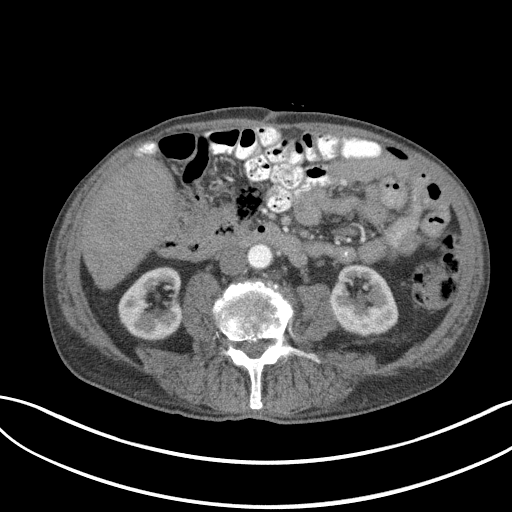
[im 120/227  soft-tissue]
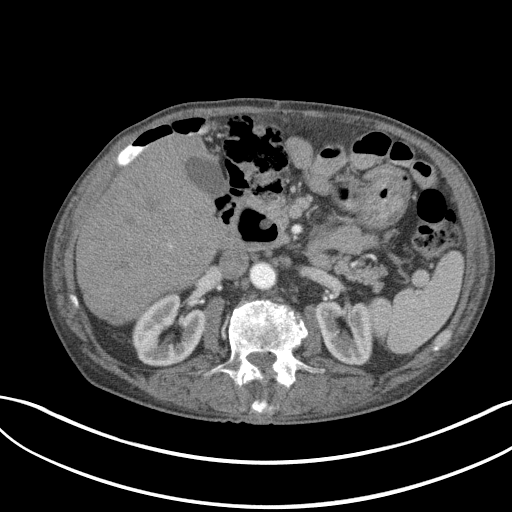
[im 147/227  soft-tissue]
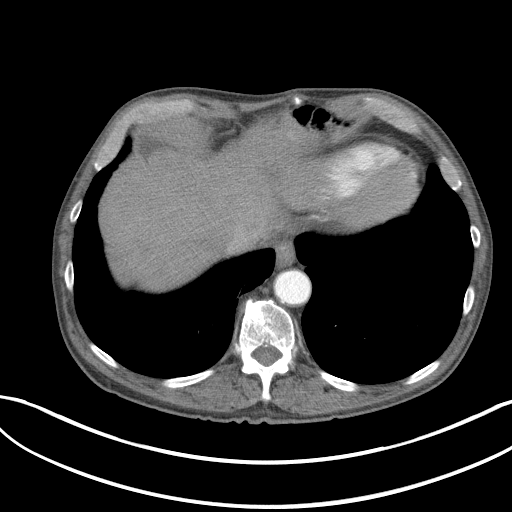
[im 160/227  soft-tissue]
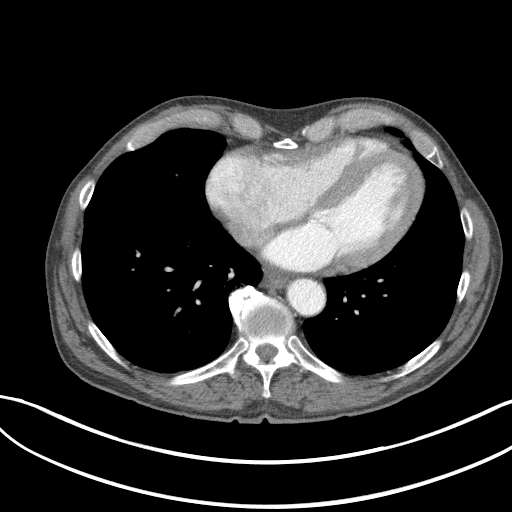
[im 160/227  bone]
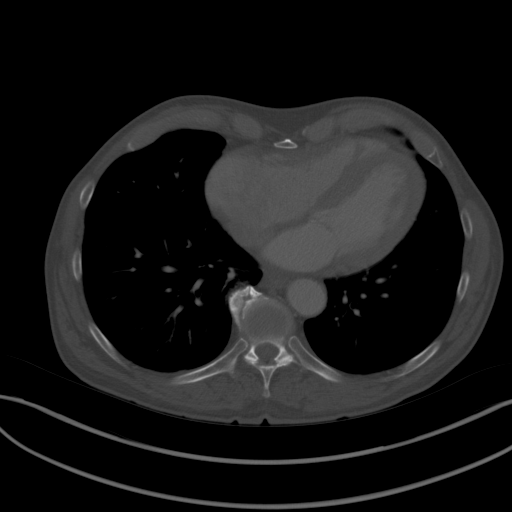
[im 173/227  soft-tissue]
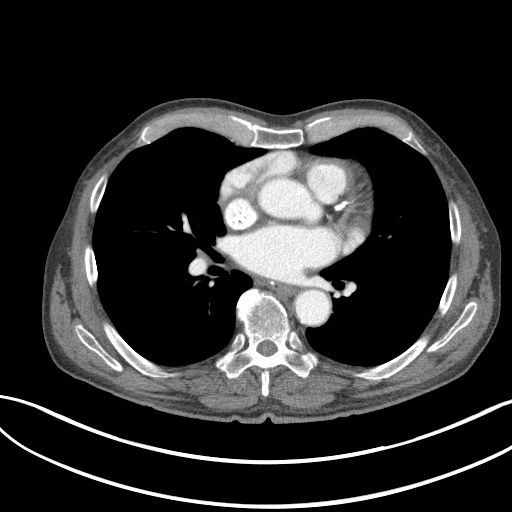
[im 173/227  lung]
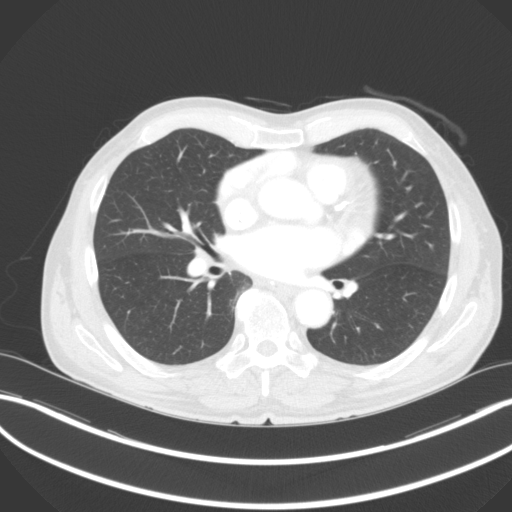
[im 187/227  lung]
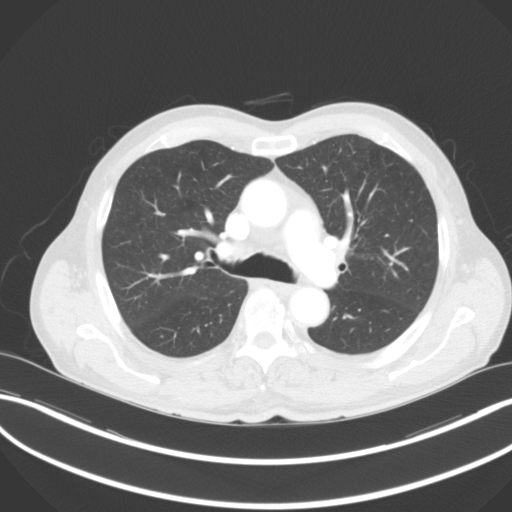
[im 200/227  soft-tissue]
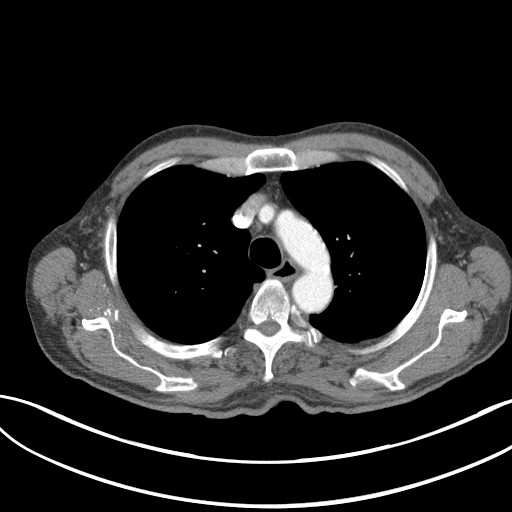
[im 200/227  lung]
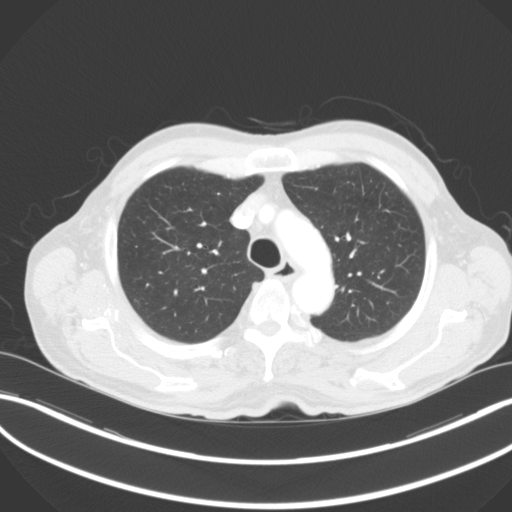
[im 213/227  soft-tissue]
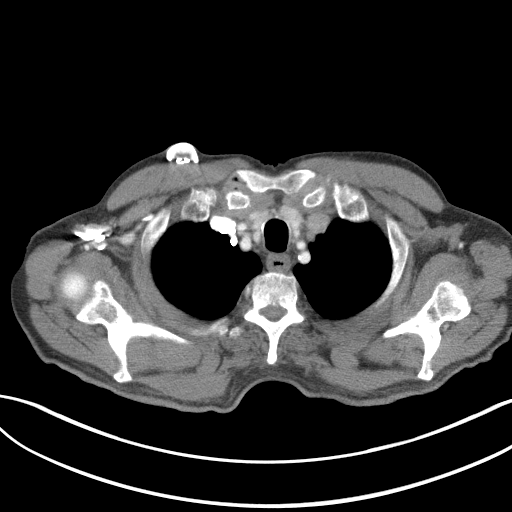
[im 213/227  lung]
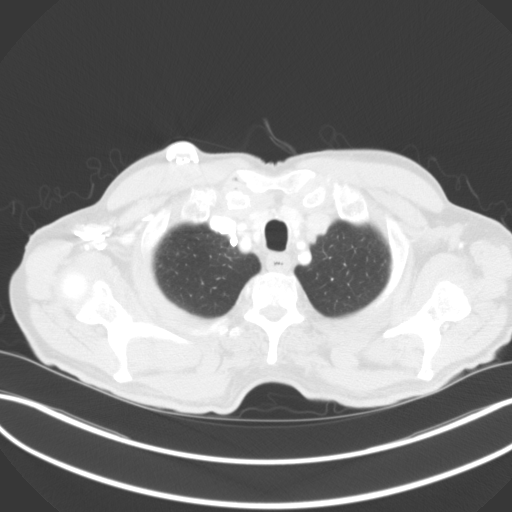

[13 of 32 positions shown; findings below may reference images not displayed]

FINDINGS: LUNGS AND PLEURA:  The lungs are clear. No pleural effusion.  
MEDIASTINUM:  No adenopathy. Normal heart size. No pericardial effusion. 
Moderate coronary artery calcifications noted. 
CHEST WALL/AXILLA: No mass or adenopathy.  
HEPATOBILIARY: Low-density lesion in the inferior right lobe of the liver 
previously seen on 05/25/2021 and measures 1.6 cm in its greatest dimension at 
that time and measuring 1.2 cm on 02/12/2022, is only faintly visualized 
measuring 0.8 cm as seen on axial image #111 of series #2. Low-density lesion in 
the peripheral superior right lobe of the liver now measures up to 2.0 cm is 
greatest diameter and previously measured up to 2.6 cm on 05/25/2021, this is 
also improved when compared to 02/02/2022 were also measured 2.6 cm. No new liver 
lesions are seen. 
SPLEEN: Normal in size. 
PANCREAS: No ductal dilatation or mass.   
ADRENALS: No mass. 
GENITOURINARY: No evidence for enhancing mass, stones or hydronephrosis. No 
bladder mass. No residual urinary bladder stone. 
LYMPH NODES: No adenopathy. 
STOMACH, SMALL BOWEL AND COLON: No bowel wall thickening or obstruction. 
VASCULAR STRUCTURES: No aneurysm.  
MUSCULOSKELETAL: No acute osseous abnormality. Scattered degenerative changes. 
Stable degenerative changes in the lumbar spine. 
ADDITIONAL FINDINGS: Enlarged prostate which is unchanged which indents the base 
of urinary bladder.
IMPRESSION: Improving liver metastases with no new liver metastasis seen. 
No evidence of metastatic disease in the chest. 
RADIATION DOSE REDUCTION: All CT scans are performed using radiation dose 
reduction techniques, when applicable.  Technical factors are evaluated and 
adjusted to ensure appropriate moderation of exposure.  Automated dose 
management technology is applied to adjust the radiation doses to minimize 
exposure while achieving diagnostic quality images.

## 2022-05-20 IMAGING — MR MRI ABDOMEN W/WO CONTRAST
23 of 29 series · 35 of 48 positions shown · IV contrast (gadolinium)
Comparison: CT exam of 04/15/2022. Exams dating back to 02/26/2021.

________________________________________________________________________________________________ 
MRI ABDOMEN W/WO CONTRAST, 05/20/2022 [DATE]: 
CLINICAL INDICATION: Follow-up exam. History of gastric cancer with chemotherapy 
and radiation therapy. Secondary malignant neoplasm of liver.
TECHNIQUE: Multiplanar, multiecho position MR images of the abdomen were 
performed without and with intravenous gadolinium enhancement.  6.5 mL of 
Gadavist were injected intravenously by hand. 1.0 mL of Gadavist were discarded.

[Series 101: survey-head 1st · axial · 15.0mm · 1.76mm/px · 1 of 15 slices shown]
[im 1/15]
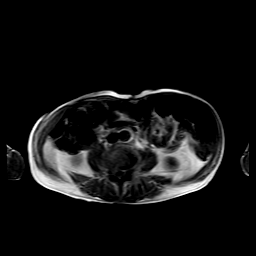

[Series 201: T2 · coronal · 5.0mm · 0.88mm/px · 1 of 36 slices shown]
[im 1/36]
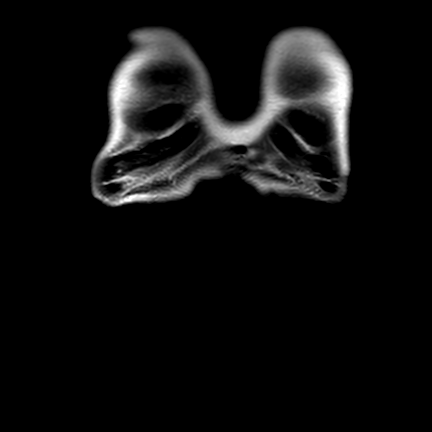

[Series 302: sout of phase · axial · 6.0mm · 1.14mm/px · 1 of 36 slices shown]
[im 1/36]
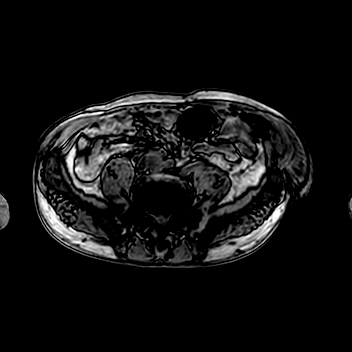

[Series 303: sin phase · axial · 6.0mm · 1.14mm/px · 1 of 36 slices shown]
[im 1/36]
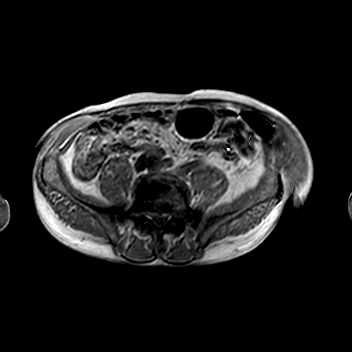

[Series 401: t2_ax_mvxd_hr_rt · axial · 5.0mm · 0.78mm/px · 1 of 40 slices shown]
[im 1/40]
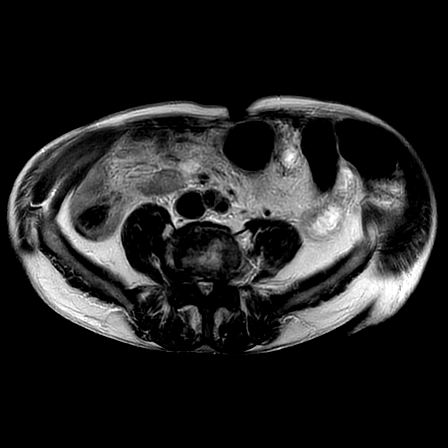

[Series 501: t2_spair mvxd_rt_fast · axial · 5.0mm · 0.88mm/px · 1 of 40 slices shown]
[im 1/40]
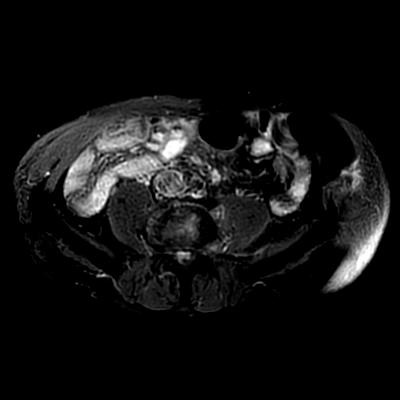

[Series 602: sbo · axial · 5.0mm · 1.76mm/px · 1 of 44 slices shown]
[im 1/44]
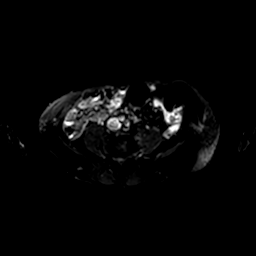

[Series 603: (id) · axial · 5.0mm · 1.76mm/px · 1 of 44 slices shown]
[im 1/44]
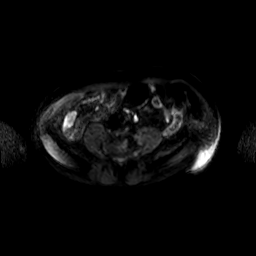

[Series 604: dadc 600 · axial · 5.0mm · 1.76mm/px · 1 of 44 slices shown]
[im 1/44]
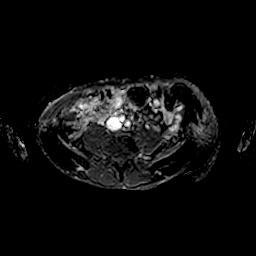

[Series 702: DIXON · axial · 4.0mm · 0.91mm/px · 1 of 120 slices shown (1 of 14)]
[im 1/120]
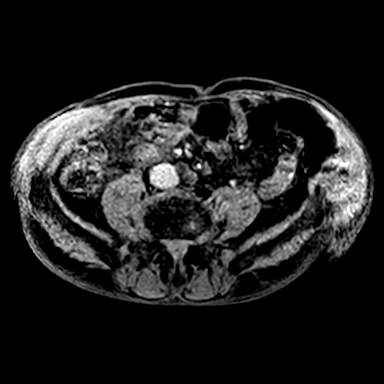

[Series 703: DIXON · axial · 4.0mm · 0.91mm/px · z∈[-79,+159]mm · 2 of 120 slices shown (2 of 14)]
[im 1/120]
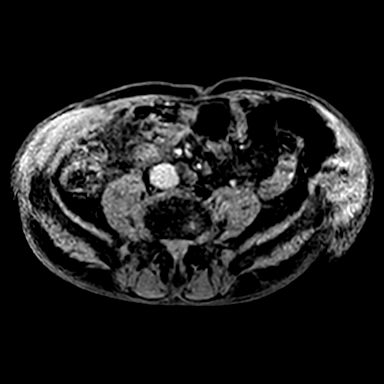
[im 120/120]
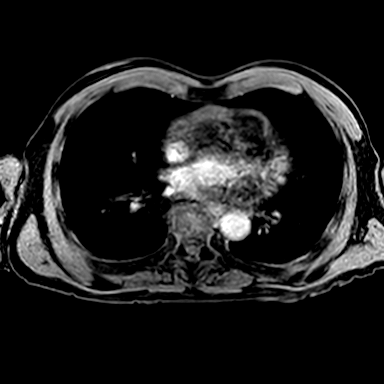

[Series 704: DIXON · axial · 4.0mm · 0.91mm/px · z∈[-79,+159]mm · 2 of 120 slices shown (3 of 14)]
[im 1/120]
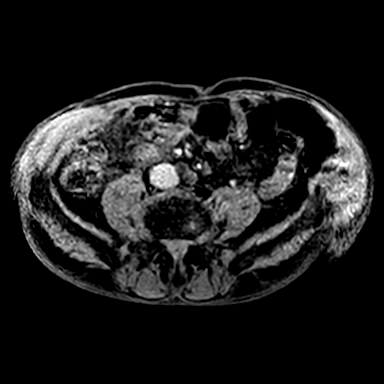
[im 120/120]
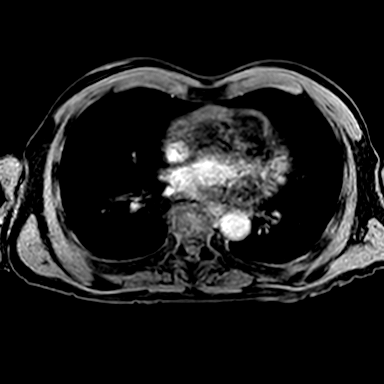

[Series 705: DIXON · axial · 4.0mm · 0.91mm/px · z∈[-79,+159]mm · 2 of 120 slices shown (4 of 14)]
[im 1/120]
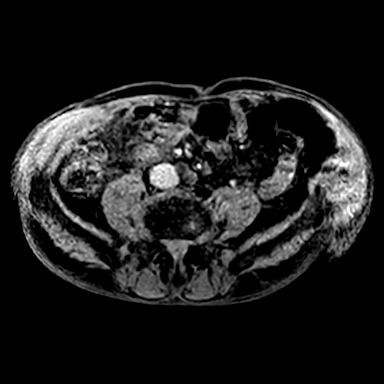
[im 120/120]
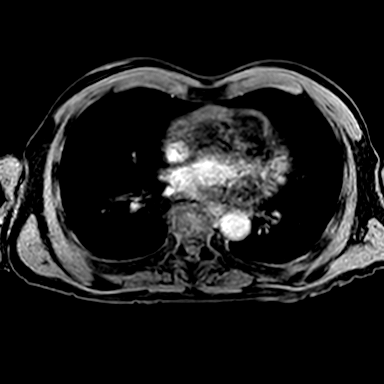

[Series 706: DIXON · axial · 4.0mm · 0.91mm/px · z∈[-79,+159]mm · 2 of 120 slices shown (5 of 14)]
[im 1/120]
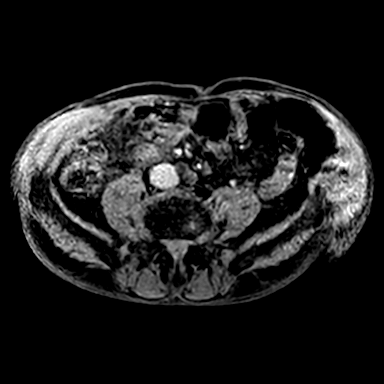
[im 120/120]
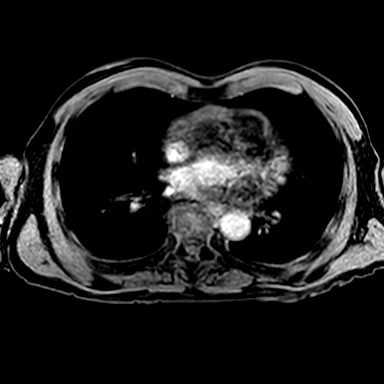

[Series 802: DIXON · axial · 4.0mm · 0.91mm/px · z∈[-79,+159]mm · 2 of 120 slices shown (6 of 14)]
[im 1/120]
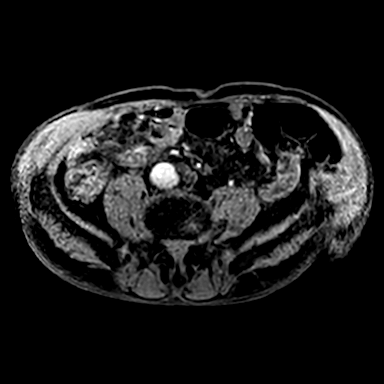
[im 120/120]
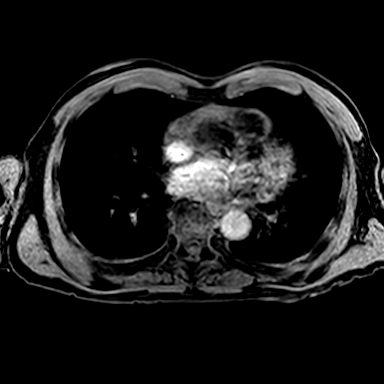

[Series 803: DIXON · axial · 4.0mm · 0.91mm/px · z∈[-79,+159]mm · 2 of 120 slices shown (7 of 14)]
[im 1/120]
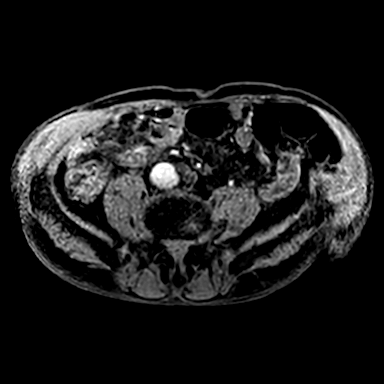
[im 120/120]
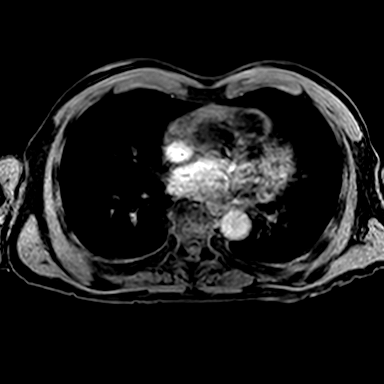

[Series 804: DIXON · axial · 4.0mm · 0.91mm/px · z∈[-79,+159]mm · 2 of 120 slices shown (8 of 14)]
[im 1/120]
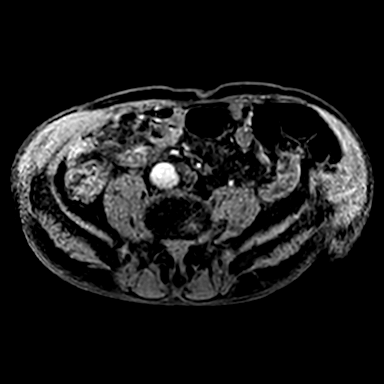
[im 120/120]
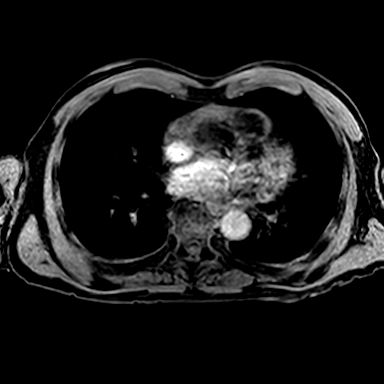

[Series 805: DIXON · axial · 4.0mm · 0.91mm/px · z∈[-79,+159]mm · 2 of 120 slices shown (9 of 14)]
[im 1/120]
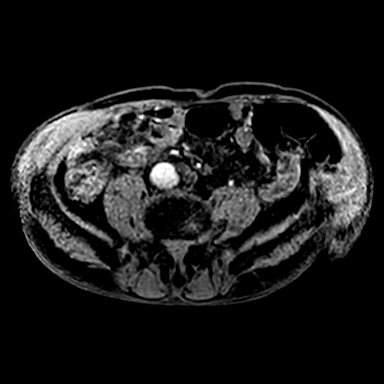
[im 120/120]
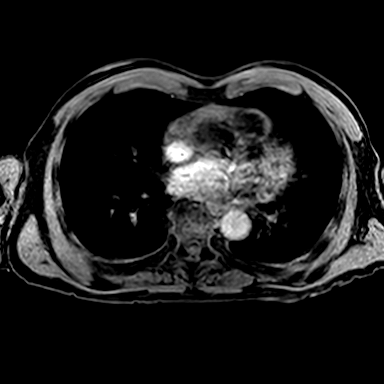

[Series 806: DIXON · axial · 4.0mm · 0.91mm/px · z∈[-79,+159]mm · 2 of 120 slices shown (10 of 14)]
[im 1/120]
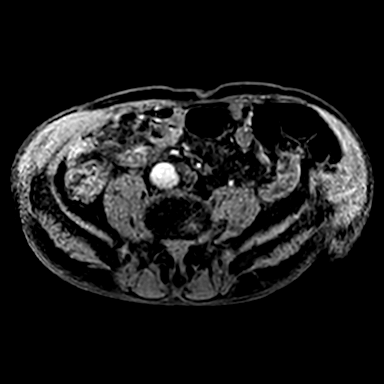
[im 120/120]
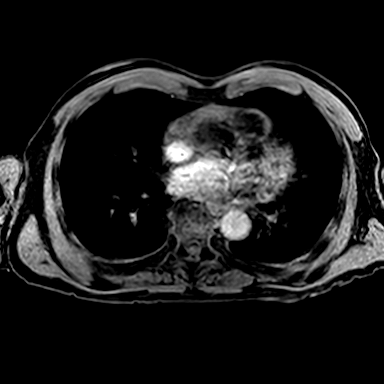

[Series 902: DIXON · axial · 4.0mm · 0.91mm/px · z∈[-79,+159]mm · 2 of 120 slices shown (11 of 14)]
[im 1/120]
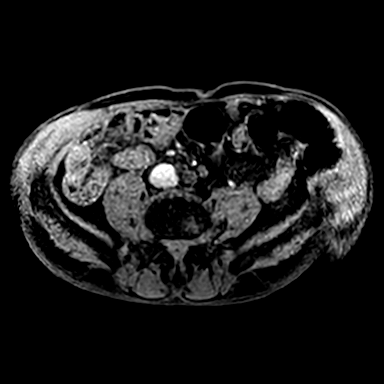
[im 120/120]
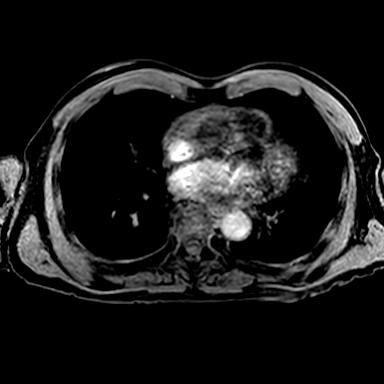

[Series 903: DIXON · axial · 4.0mm · 0.91mm/px · z∈[-79,+159]mm · 2 of 120 slices shown (12 of 14)]
[im 1/120]
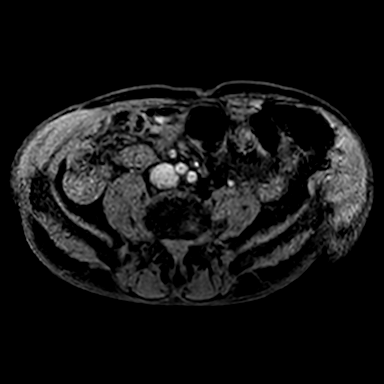
[im 120/120]
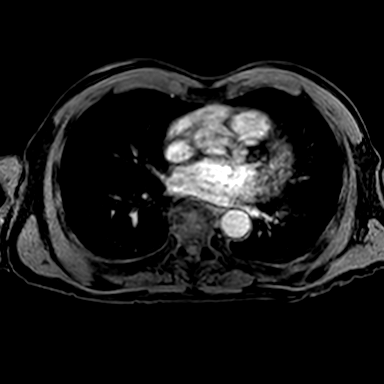

[Series 904: DIXON · axial · 4.0mm · 0.91mm/px · z∈[-79,+159]mm · 2 of 120 slices shown (13 of 14)]
[im 1/120]
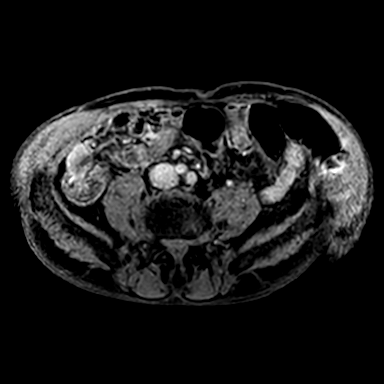
[im 120/120]
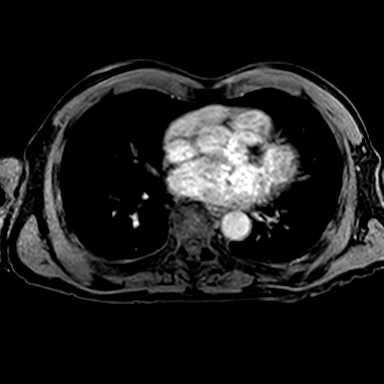

[Series 905: DIXON · axial · 4.0mm · 0.91mm/px · 1 of 120 slices shown (14 of 14)]
[im 1/120]
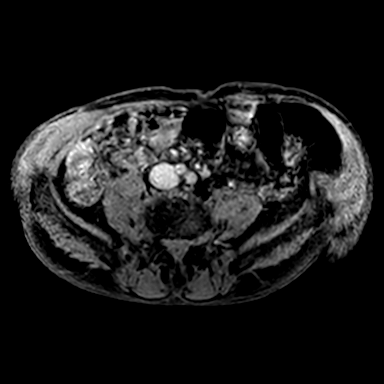

[35 of 48 positions shown; findings below may reference images not displayed]

FINDINGS: The lesion which was low density and progressively decreasing in size within the 
periphery of the right lobe of the liver on the prior CT exams is of fluid 
signal intensity with scarring within this location on todays MR exam. Area of 
fluid signal intensity measures up to 1.5 cm, series 401, image 9 with capsular 
retraction without enhancing lesion. 
The second lesion which is poorly defined on the most recent CT exam measuring 
less than 1 cm within the inferior right hepatic lobe is not delineated on 
todays MR exam. 
There is a 5 mm cyst within the right hepatic lobe, series 401, image 13. No 
abnormal hepatic enhancement. 
No lymphadenopathy. Included portions of the lower chest are negative. The 
spleen, pancreas, and biliary tree are negative. Gallbladder phrygian cap. There 
are few subtle layering gallstones. Duodenal diverticulum is again seen. Adrenal 
glands and kidneys are negative. Aorta and cava are negative. Scattered 
degenerative changes and mild lumbar curvature.
IMPRESSION: Continued improvement with scarring present within the periphery of the right 
hepatic lobe at site of prior largest lesion. No new hepatic metastasis.

## 2022-07-07 LAB — LIPID PROFILE (EXT)
Chol/HDL Ratio (EXT): 3.3
Cholesterol (EXT): 140 mg/dL (ref <=200)
HDL Cholesterol (EXT): 43 mg/dL (ref 41–60)
LDL Cholesterol (EXT): 78 mg/dL (ref <=130)
Triglycerides (EXT): 58 mg/dL (ref <=150)

## 2022-07-07 LAB — UNMAPPED LAB RESULTS: Phosphorous (EXT): 4.2 mg/dL (ref 2.4–5.1)

## 2022-08-31 LAB — UNMAPPED LAB RESULTS: Phosphorous (EXT): 3.4 mg/dL (ref 2.4–5.1)

## 2023-04-11 LAB — OCCULT BLOOD, FECAL (EXT)
Lot #: 50732
Occult Blood Fecal (EXT): NEGATIVE Negative

## 2023-08-08 ENCOUNTER — Ambulatory Visit: Admit: 2023-08-08 | Discharge: 2023-08-08 | Payer: MEDICARE | Attending: Otolaryngology

## 2023-08-08 ENCOUNTER — Encounter: Payer: MEDICARE | Attending: Otolaryngology

## 2023-08-08 DIAGNOSIS — R040 Epistaxis: Secondary | ICD-10-CM

## 2023-08-08 NOTE — Progress Notes (Signed)
 [facility]  [facility]  [address]  [place] [address]  Dept: [phone]  Dept Fax: [phone]     Chief Complaint: Michael Griffin is a 89 y.o. adult who presents for evaluation of nosebleeds.     Subjective   He is undergoing chemotherapy for gastric adenocarcinoma and has developed nosebleeds. He is on eliquis. His nosebleeds occur in the right nostril, will stop after one minute. They can occur when at rest or when straining. The nosebleeds occur intermittently. Over the past three months, he has had three nosebleeds. Over the past three weeks, he has had two nosebleeds. He has used saline nasal sprays which helps somewhat. Denies nasal obstruction, nasal drainage.    Medical History[1]     Surgical History[2]     Family History[3]     Social History     Socioeconomic History    Marital status: Married     Spouse name: Not on file    Number of children: Not on file    Years of education: Not on file    Highest education level: Not on file   Occupational History    Not on file   Tobacco Use    Smoking status: Not on file    Smokeless tobacco: Not on file   Substance and Sexual Activity    Alcohol use: Not on file    Drug use: Not on file    Sexual activity: Not on file   Other Topics Concern    Not on file   Social History Narrative    Not on file     Social Determinants of Health     Financial Resource Strain: Not on file   Food Insecurity: Not on file   Transportation Needs: Not on file   Physical Activity: Not on file   Stress: Not on file   Social Connections: Not on file   Intimate Partner Violence: Not on file   Housing Stability: Not on file        Current Medications[4]     Allergies[5]     Review of Systems:  Pertinent positives and HEENT review of systems noted in HPI.    Objective   Visit Vitals  Ht 1.626 m   Wt 59 kg   BMI 22.31 kg/m   BSA 1.63 m       ENT Physical Exam    Constitutional:  General Appearance: No  apparent distress, alert.   Orbits  Extraocular movements are intact, no spontaneous nystagmus.   Ear:  Right: Pinna without lesions. External auditory canal is clear without edema or erythema. The tympanic membrane is intact without evidence of middle ear effusion.   Left: Pinna without lesions. External auditory canal is clear without edema or erythema. The tympanic membrane is intact without evidence of middle ear effusion.   Facial nerve: Facial movement is intact and symmetric.   Nose: Speculum exam: Anterior nasal cavity is clear. Small punctate vessel in the right inferior septal mucosa. The anterior face of the bilateral inferior turbinates is unremarkable. No obvious purulence or polyps.  Oral cavity/Oropharynx: Lips: No lesions. Floor of mouth: without lesions. Tongue: symmetric movement, no masses. Palate: no masses, symmetric elevation, uvula midline. Oropharynx:  no erythema or exudate.  No masses or lesions.   Posterior oropharyngeal wall without mass or lesion.  Normal mucosa.   Larynx/Hypopharynx: voice is normal.  Neck: Thyroid: No palpable nodules. Palpation: No masses,  adenopathy.   Skin of Head/Neck: No obvious lesions.   Cranial Nerve assessment:  Nerves 3 - 12: symmetric bilaterally.      Assessment/Plan   Michael Griffin was seen today for epistaxis (nose bleed).  Epistaxis  Nasal dryness  Chronic anticoagulation    He reports occasional right-sided nosebleeds over the past few months. Exam is notable for a raised vessel in the right antero-inferior septal mucosa. After discussion, will trial ayr saline nasal gel to improve intranasal moisture. If symptoms persist, then can perform cauterization.    Michael Griffin feels comfortable with the plan as outlined above. All questions and concerns were addressed in full detail. I will be contacted if there are any persistent, worsening, recurrent or new problems or concerns. Potential worrisome issues and symptoms reviewed. It was a pleasure meeting with  Michael Griffin today.         [1] History reviewed. No pertinent past medical history.  [2] History reviewed. No pertinent surgical history.  [3] No family history on file.  [4]   No current outpatient medications on file.     No current facility-administered medications for this visit.   [5] Not on File

## 2023-08-08 NOTE — Addendum Note (Signed)
 Addended by: Lucretia Pendley on: 08/08/2023 12:44 PM     Modules accepted: Orders

## 2023-10-03 ENCOUNTER — Ambulatory Visit: Admit: 2023-10-03 | Discharge: 2023-10-03 | Payer: MEDICARE | Attending: Otolaryngology

## 2023-10-03 DIAGNOSIS — R040 Epistaxis: Principal | ICD-10-CM

## 2023-10-03 NOTE — Progress Notes (Signed)
 [facility]  [facility]  [address]  [place] [address]  Dept: [phone]  Dept Fax: [phone]     Chief Complaint: Michael Griffin is a 89 y.o. adult who presents for evaluation of nosebleeds.     Subjective   No interval nosebleeds. He is using ayr saline nasal gel occasionally now.    Prior History: He is undergoing chemotherapy for gastric adenocarcinoma and has developed nosebleeds. He is on eliquis. His nosebleeds occur in the right nostril, will stop after one minute. They can occur when at rest or when straining. The nosebleeds occur intermittently. Over the past three months, he has had three nosebleeds. Over the past three weeks, he has had two nosebleeds. He has used saline nasal sprays which helps somewhat. Denies nasal obstruction, nasal drainage.    Medical History[1]     Surgical History[2]     Family History[3]     Social History     Socioeconomic History    Marital status: Married     Spouse name: Not on file    Number of children: Not on file    Years of education: Not on file    Highest education level: Not on file   Occupational History    Not on file   Tobacco Use    Smoking status: Never    Smokeless tobacco: Never   Substance and Sexual Activity    Alcohol use: Not on file    Drug use: Not on file    Sexual activity: Not on file   Other Topics Concern    Not on file   Social History Narrative    Not on file     Social Determinants of Health     Financial Resource Strain: Not on file   Food Insecurity: Not on file   Transportation Needs: Not on file   Physical Activity: Not on file   Stress: Not on file   Social Connections: Not on file   Intimate Partner Violence: Not on file   Housing Stability: Not on file        Current Medications[4]     Allergies[5]     Review of Systems:  Pertinent positives and HEENT review of systems noted in HPI.    Objective   There were no vitals taken for this visit.      ENT  Physical Exam    Constitutional:  General Appearance: No apparent distress, alert.   Orbits  Extraocular movements are intact, no spontaneous nystagmus.   Ear:  Right: Pinna without lesions. External auditory canal is clear without edema or erythema. The tympanic membrane is intact without evidence of middle ear effusion.   Left: Pinna without lesions. External auditory canal is clear without edema or erythema. The tympanic membrane is intact without evidence of middle ear effusion.   Facial nerve: Facial movement is intact and symmetric.   Nose: Speculum exam: Anterior nasal cavity is clear. Small punctate vessel in the right inferior septal mucosa is now smaller. The anterior face of the bilateral inferior turbinates is unremarkable. No obvious purulence or polyps.  Oral cavity/Oropharynx: Lips: No lesions. Floor of mouth: without lesions. Tongue: symmetric movement, no masses. Palate: no masses, symmetric elevation, uvula midline. Oropharynx:  no erythema or exudate.  No masses or lesions.   Posterior oropharyngeal wall without mass or lesion.  Normal mucosa.   Larynx/Hypopharynx: voice is normal.  Neck: Thyroid: No palpable nodules. Palpation:  No masses, adenopathy.   Skin of Head/Neck: No obvious lesions.   Cranial Nerve assessment:  Nerves 3 - 12: symmetric bilaterally.      Assessment/Plan   Lancelot was seen today for follow-up and epistaxis (nose bleed).  Epistaxis  Chronic anticoagulation      He reports occasional right-sided nosebleeds over the past few months. Exam is notable for a raised vessel in the right antero-inferior septal mucosa, now improved. Will continue ayr saline nasal gel to improve intranasal moisture. If symptoms persist, then can perform cauterization.    Hazim Treadway feels comfortable with the plan as outlined above. All questions and concerns were addressed in full detail. I will be contacted if there are any persistent, worsening, recurrent or new problems or concerns. Potential  worrisome issues and symptoms reviewed. It was a pleasure meeting with Jhonathan Desroches today.           [1] History reviewed. No pertinent past medical history.  [2]   Past Surgical History:  Procedure Laterality Date    TONSILLECTOMY     [3] No family history on file.  [4]   Current Outpatient Medications   Medication Sig Dispense Refill    alfuzosin (Uroxatral) 10 mg 24 hr tablet Take 10 mg by mouth in the morning.      amiodarone (Pacerone) 200 mg tablet Take 200 mg by mouth.      apixaban (Eliquis) 5 mg tablet Take 5 mg by mouth in the morning and 5 mg in the evening.      dutasteride (Avodart) 0.5 mg capsule Take 0.5 mg by mouth.      pravastatin (Pravachol) 20 mg tablet       VyndaqeL 20 mg capsule Take 2 capsules by mouth twice daily.      zinc acetate 50 mg (zinc) capsule Take by mouth.       No current facility-administered medications for this visit.   [5] No Known Allergies

## 2023-12-15 LAB — UNMAPPED LAB RESULTS: Phosphorous (EXT): 3.5 mg/dL (ref 2.4–5.1)

## 2024-03-05 ENCOUNTER — Ambulatory Visit: Admit: 2024-03-05 | Discharge: 2024-03-05 | Payer: MEDICARE | Attending: Otolaryngology

## 2024-03-05 DIAGNOSIS — R040 Epistaxis: Principal | ICD-10-CM

## 2024-03-05 NOTE — Progress Notes (Signed)
 [facility]  [facility]  [address], [address]  [place] [address]  Dept: [phone]  Dept Fax: [phone]     Chief Complaint: Michael Griffin is a 89 y.o. adult who presents for evaluation of nosebleeds.     Subjective   No interval nosebleeds. He stopped using ayr saline nasal gel.     Prior History: He is undergoing chemotherapy for gastric adenocarcinoma and has developed nosebleeds. He is on eliquis. His nosebleeds occur in the right nostril, will stop after one minute. They can occur when at rest or when straining. The nosebleeds occur intermittently. Over the past three months, he has had three nosebleeds. Over the past three weeks, he has had two nosebleeds. He has used saline nasal sprays which helps somewhat. Denies nasal obstruction, nasal drainage.    Medical History[1]     Surgical History[2]     Family History[3]     Social History     Socioeconomic History    Marital status: Married     Spouse name: Not on file    Number of children: Not on file    Years of education: Not on file    Highest education level: Not on file   Occupational History    Not on file   Tobacco Use    Smoking status: Never    Smokeless tobacco: Never   Substance and Sexual Activity    Alcohol use: Not on file    Drug use: Not on file    Sexual activity: Not on file   Other Topics Concern    Not on file   Social History Narrative    Not on file     Social Determinants of Health     Financial Resource Strain: Not on file   Food Insecurity: Not on file   Transportation Needs: Not on file   Physical Activity: Not on file   Stress: Not on file   Social Connections: Not on file   Intimate Partner Violence: Not on file   Housing Stability: Not on file        Current Medications[4]     Allergies[5]     Review of Systems:  Pertinent positives and HEENT review of systems noted in HPI.    Objective   There were no vitals taken for this visit.      ENT Physical  Exam    Constitutional:  General Appearance: No apparent distress, alert.   Orbits  Extraocular movements are intact, no spontaneous nystagmus.   Ear:  Right: Pinna without lesions. External auditory canal is clear without edema or erythema. The tympanic membrane is intact without evidence of middle ear effusion.   Left: Pinna without lesions. External auditory canal is clear without edema or erythema. The tympanic membrane is intact without evidence of middle ear effusion.   Facial nerve: Facial movement is intact and symmetric.   Nose: Speculum exam: Anterior nasal cavity is clear. Dry septal mucosa. No prominent vessels. The anterior face of the bilateral inferior turbinates is unremarkable. No obvious purulence or polyps.  Oral cavity/Oropharynx: Lips: No lesions. Floor of mouth: without lesions. Tongue: symmetric movement, no masses. Palate: no masses, symmetric elevation, uvula midline. Oropharynx:  no erythema or exudate.  No masses or lesions.   Posterior oropharyngeal wall without mass or lesion.  Normal mucosa.   Larynx/Hypopharynx: voice is normal.  Neck: Thyroid: No palpable nodules. Palpation: No masses, adenopathy.   Skin of  Head/Neck: No obvious lesions.   Cranial Nerve assessment:  Nerves 3 - 12: symmetric bilaterally.      Assessment/Plan   Michael Griffin was seen today for follow-up and epistaxis (nose bleed).  Epistaxis  Chronic anticoagulation      No interval epistaxis. No prominent vessels. F/u prn.     Michael Griffin feels comfortable with the plan as outlined above. All questions and concerns were addressed in full detail. I will be contacted if there are any persistent, worsening, recurrent or new problems or concerns. Potential worrisome issues and symptoms reviewed. It was a pleasure meeting with Michael Griffin today.             [1] History reviewed. No pertinent past medical history.  [2]   Past Surgical History:  Procedure Laterality Date    TONSILLECTOMY     [3] No family history on  file.  [4]   Current Outpatient Medications   Medication Sig Dispense Refill    alfuzosin (Uroxatral) 10 mg 24 hr tablet Take 10 mg by mouth in the morning.      amiodarone (Pacerone) 200 mg tablet Take 200 mg by mouth.      apixaban (Eliquis) 5 mg tablet Take 5 mg by mouth in the morning and 5 mg in the evening.      dutasteride (Avodart) 0.5 mg capsule Take 0.5 mg by mouth.      pravastatin (Pravachol) 20 mg tablet       sodium chloride-Aloe vera gel (Ayr Saline) gel topical gel Apply 1 Application to affected nostril(s) if needed.      VyndaqeL 20 mg capsule Take 2 capsules by mouth twice daily.      zinc acetate 50 mg (zinc) capsule Take by mouth.       No current facility-administered medications for this visit.   [5] No Known Allergies
